# Patient Record
Sex: Female | Born: 2000 | Hispanic: Yes | Marital: Single | Smoking: Never smoker
Health system: Southern US, Community
[De-identification: ages and names within clinical notes are randomized; demographics above are authoritative.]

## PROBLEM LIST (undated history)

## (undated) DIAGNOSIS — Z91018 Allergy to other foods: Secondary | ICD-10-CM

## (undated) HISTORY — DX: Allergy to other foods: Z91.018

---
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
## 2021-02-27 DIAGNOSIS — H5213 Myopia, bilateral: Secondary | ICD-10-CM | POA: Diagnosis not present

## 2021-04-27 ENCOUNTER — Other Ambulatory Visit: Payer: Self-pay

## 2021-04-27 ENCOUNTER — Encounter: Payer: Self-pay | Admitting: Pediatrics

## 2021-04-27 ENCOUNTER — Ambulatory Visit (INDEPENDENT_AMBULATORY_CARE_PROVIDER_SITE_OTHER): Payer: Medicaid Other | Admitting: Pediatrics

## 2021-04-27 VITALS — Wt 129.8 lb

## 2021-04-27 DIAGNOSIS — L739 Follicular disorder, unspecified: Secondary | ICD-10-CM

## 2021-04-27 DIAGNOSIS — L910 Hypertrophic scar: Secondary | ICD-10-CM | POA: Diagnosis not present

## 2021-04-27 MED ORDER — MUPIROCIN 2 % EX OINT: 1.0000 "application " | TOPICAL_OINTMENT | Freq: Two times a day (BID) | CUTANEOUS | 0 refills | Status: DC

## 2021-04-27 NOTE — Progress Notes (Signed)
PCP: Clifton Custard, MD   Chief Complaint  Patient presents with   Rash      Subjective:  HPI:  Carolyn Bautista is a 20 y.o. female presenting for concerns of bumps in her armpit, groin, and ear. Mom reports she noticed a ball in her right armpit one month ago and it worried her. It was about the size of a grape, firm, and mobile. It self resolved in two weeks. She thinks it was around the time of her menstrual period. She waxes her armpits. She is concerned her right ear keloid returned to its original size after steroid injection two weeks ago. She also reports bumps in her groin region that come and go. They are hard, painful, and quarter sized. Sometimes they contain clear fluid, sometimes pus. The pain is relieved with drainage of the bump. No fevers, redness, or significant swelling. She trims her pubic hair, does not shave.    REVIEW OF SYSTEMS:  GENERAL: not toxic appearing GI: no vomiting, diarrhea, constipation GU: no apparent dysuria, painful bumps in groin region SKIN: keloid right ear, left armpit swelling EXTREMITIES: No edema    Meds: Current Outpatient Medications  Medication Sig Dispense Refill   mupirocin ointment (BACTROBAN) 2 % Apply 1 application topically 2 (two) times daily. 22 g 0   EPINEPHrine 0.3 mg/0.3 mL IJ SOAJ injection INJ 0.3 ML IM ONCE (Patient not taking: Reported on 05/14/2020)     fluticasone (FLONASE) 50 MCG/ACT nasal spray Place 1-2 sprays into both nostrils daily as needed for allergies or rhinitis. 16 g 11   No current facility-administered medications for this visit.    ALLERGIES: No Active Allergies  PMH:  Past Medical History:  Diagnosis Date   Asthma, chronic 10/11/2013   Eczema 10/11/2013    PSH: History reviewed. No pertinent surgical history.  Social history:  Social History   Social History Narrative   Not on file    Family history: History reviewed. No pertinent family history.   Objective:   Physical Examination:   Temp:   Pulse:   BP:   (Blood pressure percentiles are not available for patients who are 18 years or older.)  Wt: 129 lb 12.8 oz (58.9 kg)  Ht:    BMI: There is no height or weight on file to calculate BMI. (85 %ile (Z= 1.03) based on CDC (Girls, 2-20 Years) BMI-for-age based on BMI available as of 01/08/2020 from contact on 01/08/2020.) GENERAL: Well appearing, no distress HEENT: NCAT, right ear keloid. NECK: Supple, no cervical LAD LUNGS: EWOB, CTAB, no wheeze, no crackles CARDIO: RRR, normal S1S2 no murmur, well perfused GU: No palpable inguinal lymph nodes. Evidence of resolved folliculitis on right side of mons. No abscess, erythema, swelling. EXTREMITIES: Warm and well perfused, no deformity NEURO: Awake, alert, interactive, normal strength, tone, sensation, and gait SKIN: No rash, ecchymosis or petechiae. No palpable axillary lymph nodes.     Assessment/Plan:   Carolyn Bautista is a 20 y.o. old female here for concerns of swelling in her armpit, groin, as well as follow up concerns for her keloid on her right ear. No notable axillary bumps or lymphadenopathy on exam. Could be axillary folliculitis, swollen sweat glands, or fibrocystic changes from her menstrual cycle. Genitourinary exam with signs of resolved folliculitis. Right ear keloid on posterior earlobe, reported as returned to original size prior to her steroid injection.  1. Folliculitis  - Will prescribe as needed mupirocin ointment - Counseled on shaving techniques, scent free soaps, drainage and  treatment - Counseled on when to seek oral antibiotics   2. Keloid  - Suggested follow up with dermatology who is already involved with her case  - Counseled on keloid appearance, symptoms, treatment   Follow up: Return if symptoms worsen or fail to improve.

## 2021-04-27 NOTE — Patient Instructions (Addendum)
  Adult Primary Care Clinics Name Criteria Services  [facility] Insurance   GCCN  Uninsured  Medicaid A "medical home" for adults needing healthcare when it's not an emergency. Chronic disease management Disease prevention, diagnosis and treatment Onsite point-of-care laboratory testing. Health education and prevention programs. Physicals and immunizations  [address], [place], [place] [address]  Phone: [phone] Hours: Mon-Fri 9am-6pm Walk-in: Tues 2pm-5pm                 Thurs 8:30am-4:30pm Languages:  Language line available  Serves Adult patients  WALK IN HOURS FOR CLINIC (HOSPITAL DISCOUNT): TUESDAYS 2:00PM - 5:00PM and THURSDAYS 8:30AM - 4:30PM  Space is limited, 10 on Tuesday and 20 on Thursday. It's on first come first serve basis  Name Criteria Services  [facility] Insurance   Medicaid  Primary Care  [address], [place], [place] [address]  Phone: [phone]  Languages:  Access to language line      . Women's Health/ OB Resources  Name Criteria Services  [facility] Insurance:   Medicaid  Obstetrics & gynecology     [facility] [address] [place], [place] [address]  [phone] Languages:   All (phone interpreter available)    Name Criteria Services  [facility] [place]    Hours: Mon 2pm-7pm Tues 9am-5pm Wed & Thurs- Closed Fri 9am-5pm Sat 9am-1pm Insurance   Aetna Blue Cross Blue Shield (BCBS) [employer] Med[employer] Medicaid [facility] Abortion referral Birth Control General Health Care HIV testing [facility] Morning-after Pill Pregnancy testing & services STD testing, treatment & vaccines Women's Health care  [address] [place], [place] [address]  Phone: [phone] Fax: [phone] Languages:  English   Name Criteria Services  [facility] [place]   Hours: Monday 9am-5pm Tuesday 10am-6pm Wednesday 9am-5pm Thursday 11am-7pm Friday 9am-1pm Insurance   Aetna Blue Cross Blue Shield (BCBS) [employer] Med[employer] Medicaid [facility] Abortion services Birth control General health care HIV testing [facility] Morning-after pill Pregnancy testing & services STD testing, treatment & vaccines  [address] [place], [place] [address]  Phone: [phone] Fax: [phone] Languages:  Interpretation by phone available for other languages   Name Criteria Services  [facility]  Focus: Empowering women & men to face unplanned pregnancy Insurance    Free pregnancy tests, ultrasounds, and STD tests Abortion information (about procedures, risks, alternatives- NOT performing or referring for them) Sexual health education Classes: parenting, abortion recovery, communication in relationships  [address] [place], [place] [address]  Phone: [phone] or [phone]  Hours: Monday, Wednesday, Friday 10am-5pm Tuesday, Thursday 1pm-9pm Languages:  Spanish   Christian-based organization    

## 2021-05-28 ENCOUNTER — Ambulatory Visit: Payer: Medicaid Other | Admitting: Pediatrics

## 2021-06-02 ENCOUNTER — Telehealth: Payer: Self-pay | Admitting: Pediatrics

## 2021-06-02 ENCOUNTER — Encounter: Payer: Self-pay | Admitting: Pediatrics

## 2021-06-02 ENCOUNTER — Other Ambulatory Visit (HOSPITAL_COMMUNITY)
Admission: RE | Admit: 2021-06-02 | Discharge: 2021-06-02 | Disposition: A | Discharge status: Home / Self Care | Payer: Medicaid Other | Source: Ambulatory Visit | Attending: Pediatrics | Admitting: Pediatrics

## 2021-06-02 ENCOUNTER — Other Ambulatory Visit: Payer: Self-pay

## 2021-06-02 ENCOUNTER — Ambulatory Visit (INDEPENDENT_AMBULATORY_CARE_PROVIDER_SITE_OTHER): Payer: Medicaid Other | Admitting: Pediatrics

## 2021-06-02 VITALS — BP 108/74 | Ht 59.45 in | Wt 129.5 lb

## 2021-06-02 DIAGNOSIS — Z91018 Allergy to other foods: Secondary | ICD-10-CM

## 2021-06-02 DIAGNOSIS — Z114 Encounter for screening for human immunodeficiency virus [HIV]: Secondary | ICD-10-CM | POA: Diagnosis not present

## 2021-06-02 DIAGNOSIS — Z68.41 Body mass index (BMI) pediatric, 5th percentile to less than 85th percentile for age: Secondary | ICD-10-CM

## 2021-06-02 DIAGNOSIS — Z23 Encounter for immunization: Secondary | ICD-10-CM

## 2021-06-02 DIAGNOSIS — Z6825 Body mass index (BMI) 25.0-25.9, adult: Secondary | ICD-10-CM | POA: Diagnosis not present

## 2021-06-02 DIAGNOSIS — Z113 Encounter for screening for infections with a predominantly sexual mode of transmission: Secondary | ICD-10-CM | POA: Insufficient documentation

## 2021-06-02 DIAGNOSIS — Z0000 Encounter for general adult medical examination without abnormal findings: Secondary | ICD-10-CM | POA: Diagnosis not present

## 2021-06-02 LAB — POCT RAPID HIV: Rapid HIV, POC: NEGATIVE

## 2021-06-02 MED ORDER — EPINEPHRINE 0.3 MG/0.3ML IJ SOAJ: INTRAMUSCULAR | 1 refills | Status: DC

## 2021-06-02 NOTE — Progress Notes (Signed)
Adolescent Well Care Visit Carolyn Bautista is a 20 y.o. female who is here for well care.    PCP:  Clifton Custard, MD   History was provided by the patient and mother.  Confidentiality was discussed with the patient and, if applicable, with caregiver as well.  Current Issues: Current concerns include using flonase in springtime for allergies - needs refills.    History of eye swelling after getting mosquito bite.  History allergic reaction with canned pineapple, but can eat fresh pineapple.  Also has allergy to cat.    Nutrition/Exercise: Nutrition/Eating Behaviors: balanced diet - mom cooks and sends to school Supplements/ Vitamins: none Play any Sports?/ Exercise: none  Sleep:  Sleep: all night, no concern  Social Screening: Lives with:  roommate on campus Parental relations:  good Concerns regarding behavior with peers?  no Stressors of note: no  Education: School Name: [employer] Grade: first year - studying biology, wants to be dentist  Menstruation:   No LMP recorded. Menstrual History: regular, lasts 3-4 days   Confidential Social History: Tobacco?  no Secondhand smoke exposure?  no Drugs/ETOH?  no  Sexually Active?  no   Pregnancy Prevention: abstinence - discussed condoms and birth control   Screenings: Patient has a dental home: yes  The patient completed the Rapid Assessment for Adolescent Preventive Services screening questionnaire and the following topics were identified as risk factors and discussed: healthy eating and exercise  In addition, the following topics were discussed as part of anticipatory guidance tobacco use, marijuana use, condom use, and birth control.  PHQ-9 completed and results indicated no signs of depression  Physical Exam:  Vitals:   06/02/21 0834  BP: 108/74  Weight: 129 lb 8 oz (58.7 kg)  Height: 4' 11.45" (1.51 m)   BP 108/74 (BP Location: Right Arm, Patient Position: Sitting, Cuff Size: Normal)   Ht 4' 11.45" (1.51  m)   Wt 129 lb 8 oz (58.7 kg)   BMI 25.76 kg/m  Body mass index: body mass index is 25.76 kg/m. Blood pressure percentiles are not available for patients who are 18 years or older.  Hearing Screening  Method: Audiometry   500Hz  1000Hz  2000Hz  4000Hz   Right ear 20 20 20 20   Left ear 20 20 20 20    Vision Screening   Right eye Left eye Both eyes  Without correction 20/30 20/20 20/20   With correction       General Appearance:   alert, oriented, no acute distress and well nourished  HENT: Normocephalic, no obvious abnormality, conjunctiva clear  Mouth:   Normal appearing teeth, no obvious discoloration, dental caries, or dental caps  Neck:   Supple; thyroid: no enlargement, symmetric, no tenderness/mass/nodules  Chest Normal female, no masses  Lungs:   Clear to auscultation bilaterally, normal work of breathing  Heart:   Regular rate and rhythm, S1 and S2 normal, no murmurs;   Abdomen:   Soft, non-tender, no mass, or organomegaly  GU normal female external genitalia, pelvic not performed, Tanner stage IV  Musculoskeletal:   Tone and strength strong and symmetrical, all extremities               Lymphatic:   No cervical adenopathy  Skin/Hair/Nails:   Skin warm, dry and intact, no rashes, no bruises or petechiae  Neurologic:   Strength, gait, and coordination normal and age-appropriate     Assessment and Plan:   1. Encounter for general adult medical examination without abnormal findings  2. BMI  pediatric, 5th percentile to less than 85% for age  5. Routine screening for STI (sexually transmitted infection) Patient denies sexual activity - at risk age group. - Urine cytology ancillary only - POCT Rapid HIV  4. Food allergy Refilled Epipen and placed referral back to allergist for consideration of oral challenge to canned pineapple.  - EPINEPHrine 0.3 mg/0.3 mL IJ SOAJ injection; INJ 0.3 ML IM ONCE  Dispense: 2 each; Refill: 1 - Ambulatory referral to Allergy  Hearing  screening result:normal Vision screening result: abnormal  Counseling provided for all of the vaccine components  Orders Placed This Encounter  Procedures   Meningococcal B, OMV     Return for nurse visit for Meningitis B vaccine #2 after 06/30/21.Marland Kitchen  Clifton Custard, MD

## 2021-06-02 NOTE — Telephone Encounter (Signed)
Good morning, pt needs address on [facility] to match on epic. Please give pt a call once the address on file in Epic has been changed in [place]. Thank you.

## 2021-06-02 NOTE — Telephone Encounter (Signed)
Called and let mother know address has been updated in [facility] to current address on file in Epic. Advised new immunization records can be picked up from our front desk if needed. Please print new copies from [facility] if mother comes to pick up.  

## 2021-06-03 LAB — URINE CYTOLOGY ANCILLARY ONLY
Chlamydia: NEGATIVE
Comment: NEGATIVE
Comment: NORMAL
Neisseria Gonorrhea: NEGATIVE

## 2021-07-06 ENCOUNTER — Other Ambulatory Visit: Payer: Self-pay

## 2021-07-06 ENCOUNTER — Ambulatory Visit (INDEPENDENT_AMBULATORY_CARE_PROVIDER_SITE_OTHER): Payer: Medicaid Other

## 2021-07-06 DIAGNOSIS — Z23 Encounter for immunization: Secondary | ICD-10-CM

## 2021-07-06 NOTE — Progress Notes (Signed)
Carolyn Bautista is here for MenB #2 and seasonal flu vaccine; no other questions or concerns today. Vaccines given and tolerated well; discharged home with updated vaccine record. RTC 05/2022 for PE and prn for acute care.

## 2021-07-31 ENCOUNTER — Other Ambulatory Visit: Payer: Self-pay

## 2021-07-31 ENCOUNTER — Ambulatory Visit (INDEPENDENT_AMBULATORY_CARE_PROVIDER_SITE_OTHER): Payer: Medicaid Other | Admitting: Allergy

## 2021-07-31 ENCOUNTER — Encounter: Payer: Self-pay | Admitting: Allergy

## 2021-07-31 VITALS — BP 110/78 | HR 78 | Temp 98.3°F | Resp 18 | Ht 59.0 in | Wt 125.2 lb

## 2021-07-31 DIAGNOSIS — J4520 Mild intermittent asthma, uncomplicated: Secondary | ICD-10-CM | POA: Diagnosis not present

## 2021-07-31 DIAGNOSIS — W57XXXD Bitten or stung by nonvenomous insect and other nonvenomous arthropods, subsequent encounter: Secondary | ICD-10-CM

## 2021-07-31 DIAGNOSIS — S00461D Insect bite (nonvenomous) of right ear, subsequent encounter: Secondary | ICD-10-CM

## 2021-07-31 DIAGNOSIS — T7800XD Anaphylactic reaction due to unspecified food, subsequent encounter: Secondary | ICD-10-CM

## 2021-07-31 DIAGNOSIS — J3089 Other allergic rhinitis: Secondary | ICD-10-CM

## 2021-07-31 MED ORDER — ALBUTEROL SULFATE HFA 108 (90 BASE) MCG/ACT IN AERS: 2.0000 | INHALATION_SPRAY | RESPIRATORY_TRACT | 1 refills | Status: DC | PRN

## 2021-07-31 MED ORDER — TRIAMCINOLONE ACETONIDE 0.5 % EX OINT: 1.0000 "application " | TOPICAL_OINTMENT | Freq: Two times a day (BID) | CUTANEOUS | 0 refills | Status: DC

## 2021-07-31 NOTE — Patient Instructions (Addendum)
Food Allergy -skin testing to pineapple is negative.  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  This is a long-acting antihistamine similar to Zyrtec that may be more effective -continue Flonase 2 sprays each nostril daily for 1-2 weeks at a time before stopping once nasal congestion improves for maximum benefit  Asthma -have access to albuterol inhaler 2 puffs every 4-6 hours as needed for cough/wheeze/shortness of breath/chest tightness.  May use 15-20 minutes prior to activity.   Monitor frequency of use.    Asthma control goals:  Full participation in all desired activities (may need albuterol before activity) Albuterol use two time or less a week on average (not counting use with activity) Cough interfering with sleep two time or less a month Oral steroids no more than once a year No hospitalizations  Mosquito bite sensitivity -do your best to avoid bites.  Bug repellant containing DEET are the best options to reduce bites -if you are bitten can use the following: ice affected area oral antihistamine (like Benadryl or Xyzal) oral anti-inflammatory (like Ibuprofen) topical corticosteroid (like Triamcinolone ointment)  Follow-up in 6 months or sooner if needed

## 2021-07-31 NOTE — Progress Notes (Signed)
New Patient Note  RE: Carolyn Bautista MRN: [id number] DOB: 2001-08-17 Date of Office Visit: 07/31/2021  Referring provider: Clifton Custard, MD Primary care provider: Clifton Custard, MD  Chief Complaint: Food allergy  History of present illness: Carolyn Bautista is a 20 y.o. female presenting today for consultation for food allergy.  She is a former pt of the practice with last visit about 10 years ago.   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   Mother states around 38 months old she gave her a pineapple cup and she immediately got red and had bumps all over.  Mother took her to ED for this.   At some point later she saw allergist and had positive testing to my level pineapple.  She has been avoiding ever since.    She states she is quite reactive to mosquito bites and she develop symptoms of significant swelling and fever.  She uses flonase as needed for nasal congestion.   She also reports runny nose and sneezing.  She has taken zyrtec but that didn't help.  She used to take singulair in the past as a child.    She has history of asthma.  She no longer has an albuterol inhaler and last had about 1 year ago.  She does not report any specific triggers but states she does get short of breath and has to stop physical activity.   Review of systems: Review of Systems  Constitutional: Negative.   HENT:  Positive for congestion.   Eyes: Negative.   Respiratory: Negative.    Cardiovascular: Negative.   Gastrointestinal: Negative.   Musculoskeletal: Negative.   Skin: Negative.   Neurological: Negative.    All other systems negative unless noted above in HPI  Past medical history: Past Medical History:  Diagnosis Date   Asthma, chronic 10/11/2013   Eczema 10/11/2013   Food allergy     Past surgical history: History reviewed. No pertinent surgical history.  Family history:  Family History  Problem Relation Age of Onset   Asthma  Mother    Asthma Sister    Asthma Sister     Social history: Lives in a home without carpeting with gas heating and central cooling.  Dogs in the home.  No concern for water damage, mildew or roaches in the home.  Freshman at [employer].  Denies smoking history.    Medication List: Current Outpatient Medications  Medication Sig Dispense Refill   EPINEPHrine 0.3 mg/0.3 mL IJ SOAJ injection INJ 0.3 ML IM ONCE 2 each 1   fluticasone (FLONASE) 50 MCG/ACT nasal spray Place 1-2 sprays into both nostrils daily as needed for allergies or rhinitis. (Patient not taking: No sig reported) 16 g 11   No current facility-administered medications for this visit.    Known medication allergies: No Known Allergies   Physical examination: Blood pressure 110/78, pulse 78, temperature 98.3 F (36.8 C), temperature source Temporal, resp. rate 18, height 4\' 11"  (1.499 m), weight 125 lb 4 oz (56.8 kg), SpO2 97 %.  General: Alert, interactive, in no acute distress. HEENT: PERRLA, TMs pearly gray, turbinates non-edematous without discharge, post-pharynx non erythematous. Neck: Supple without lymphadenopathy. Lungs: Clear to auscultation without wheezing, rhonchi or rales. {no increased work of breathing. CV: Normal S1, S2 without murmurs. Abdomen: Nondistended, nontender. Skin: Warm and dry, without lesions or rashes. Extremities:  No clubbing, cyanosis or edema. Neuro:   Grossly intact.  Diagnositics/Labs:  Spirometry:  FEV1: 2.49L 86%, FVC: 3.59L 113%, ratio consistent with nonobstructive pattern  Allergy testing: environmental allergy skin prick testing is oak and alternaira Pineapple skin prick testing is negative Allergy testing results were read and interpreted by provider, documented by clinical staff.   Assessment and plan:   Food Allergy -skin testing to pineapple is negative.  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  This is a long-acting antihistamine similar to Zyrtec that may be more effective -continue Flonase 2 sprays each nostril daily for 1-2 weeks at a time before stopping once nasal congestion improves for maximum benefit  Asthma -have access to albuterol inhaler 2 puffs every 4-6 hours as needed for cough/wheeze/shortness of breath/chest tightness.  May use 15-20 minutes prior to activity.   Monitor frequency of use.    Asthma control goals:  Full participation in all desired activities (may need albuterol before activity) Albuterol use two time or less a week on average (not counting use with activity) Cough interfering with sleep two time or less a month Oral steroids no more than once a year No hospitalizations  Mosquito bite sensitivity -do your best to avoid bites.  Bug repellant containing DEET are the best options to reduce bites -if you are bitten can use the following: ice affected area oral antihistamine (like Benadryl or Xyzal) oral anti-inflammatory (like Ibuprofen) topical corticosteroid (like Triamcinolone ointment)  Follow-up in 6 months or sooner if needed  I appreciate the opportunity to take part in Akesha's care. Please do not hesitate to contact me with questions.  Sincerely,   , MD Allergy/Immunology [facility]

## 2021-08-07 LAB — ALLERGEN, PINEAPPLE, F210: Pineapple IgE: <0.1 kU/L

## 2021-09-08 ENCOUNTER — Other Ambulatory Visit: Payer: Self-pay | Admitting: Allergy

## 2022-02-28 DIAGNOSIS — H5213 Myopia, bilateral: Secondary | ICD-10-CM | POA: Diagnosis not present

## 2022-04-06 ENCOUNTER — Encounter (HOSPITAL_COMMUNITY): Payer: Self-pay

## 2022-04-06 ENCOUNTER — Ambulatory Visit (HOSPITAL_COMMUNITY)
Admission: EM | Admit: 2022-04-06 | Discharge: 2022-04-06 | Disposition: A | Discharge status: Home / Self Care | Payer: Medicaid Other | Attending: Emergency Medicine | Admitting: Emergency Medicine

## 2022-04-06 DIAGNOSIS — R21 Rash and other nonspecific skin eruption: Secondary | ICD-10-CM

## 2022-04-06 DIAGNOSIS — L237 Allergic contact dermatitis due to plants, except food: Secondary | ICD-10-CM

## 2022-04-06 MED ORDER — HYDROCORTISONE 2.5 % EX LOTN: TOPICAL_LOTION | Freq: Two times a day (BID) | CUTANEOUS | 0 refills | Status: DC

## 2022-04-06 NOTE — ED Provider Notes (Signed)
[facility]    CSN: [id number] Arrival date & time: 04/06/22  1834     History   Chief Complaint Chief Complaint  Patient presents with   Rash    HPI Carolyn Bautista is a 21 y.o. female.  Presents with 1 week history of rash on right arm.  Worse over the last 2 days.  Itchy. Has been scratching at it and noticed some spots begin on the left arm. Has tried benadryl cream.  Denies fever, cough or congestion, shortness of breath, chest pain, abdominal pain, vomiting/diarrhea, lip or tongue swelling.  No known allergies.  History of eczema  Past Medical History:  Diagnosis Date   Asthma, chronic 10/11/2013   Eczema 10/11/2013   Food allergy     Patient Active Problem List   Diagnosis Date Noted   Wears glasses 11/17/2017   Allergic rhinitis 10/11/2013    History reviewed. No pertinent surgical history.  OB History   No obstetric history on file.      Home Medications    Prior to Admission medications   Medication Sig Start Date End Date Taking? Authorizing Provider  hydrocortisone 2.5 % lotion Apply topically 2 (two) times daily. 04/06/22  Yes Petina Muraski, Lurena Joiner, PA-C  albuterol (VENTOLIN HFA) 108 (90 Base) MCG/ACT inhaler Inhale 2 puffs into the lungs every 4 (four) hours as needed for wheezing or shortness of breath. 07/31/21   Marcelyn Bruins, MD  EPINEPHrine 0.3 mg/0.3 mL IJ SOAJ injection INJ 0.3 ML IM ONCE 06/02/21   Ettefagh, Aron Baba, MD  fluticasone [facility]) 50 MCG/ACT nasal spray Place 1-2 sprays into both nostrils daily as needed for allergies or rhinitis. Patient not taking: No sig reported 01/08/20   Ettefagh, Aron Baba, MD  triamcinolone ointment (KENALOG) 0.5 % Apply 1 application topically 2 (two) times daily. For insect bites 07/31/21   Marcelyn Bruins, MD    Family History Family History  Problem Relation Age of Onset   Asthma Mother    Asthma Sister    Asthma Sister     Social History Social History   Tobacco Use    Smoking status: Never   Smokeless tobacco: Never     Allergies   Patient has no known allergies.   Review of Systems Review of Systems Per HPI  Physical Exam Triage Vital Signs No data found.  Updated Vital Signs BP 123/79 (BP Location: Left Arm)   Pulse 97   Temp 98.8 F (37.1 C) (Oral)   Resp 16   LMP 03/06/2022 (Approximate)   SpO2 98%    Physical Exam Vitals and nursing note reviewed.  Constitutional:      General: She is not in acute distress.    Appearance: Normal appearance.  HENT:     Mouth/Throat:     Mouth: Mucous membranes are moist.     Pharynx: Oropharynx is clear. No posterior oropharyngeal erythema.  Eyes:     Conjunctiva/sclera: Conjunctivae normal.     Pupils: Pupils are equal, round, and reactive to light.  Cardiovascular:     Rate and Rhythm: Normal rate and regular rhythm.     Pulses: Normal pulses.     Heart sounds: Normal heart sounds.  Pulmonary:     Effort: Pulmonary effort is normal. No respiratory distress.     Breath sounds: Normal breath sounds. No wheezing.  Abdominal:     General: Bowel sounds are normal.     Tenderness: There is no abdominal tenderness.  Musculoskeletal:  General: Normal range of motion.     Cervical back: Normal range of motion.  Skin:    Findings: Rash present.     Comments: Maculopapular, erythematous area consistent with poison ivy, some linear streaks.  Located to right forearm, a couple areas beginning on the left arm  Neurological:     Mental Status: She is alert and oriented to person, place, and time.     UC Treatments / Results  Labs (all labs ordered are listed, but only abnormal results are displayed) Labs Reviewed - No data to display  EKG  Radiology No results found.  Procedures Procedures   Medications Ordered in UC Medications - No data to display  Initial Impression / Assessment and Plan / UC Course  I have reviewed the triage vital signs and the nursing  notes.  Pertinent labs & imaging results that were available during my care of the patient were reviewed by me and considered in my medical decision making (see chart for details).  Rash on right arm appears consistent with poison ivy.  Some mild spreading to left arm. She has not been outdoors or in the woods but she does have a dog who spends time outside.  Hydrocortisone cream twice daily.  Recommend to not itch at the area as this can cause spreading and possibly bacterial infection. Return precautions discussed. Patient agrees to plan and is discharged in stable condition.  Final Clinical Impressions(s) / UC Diagnoses   Final diagnoses:  Rash  Poison ivy     Discharge Instructions      Apply the topical steroid cream twice daily.  Please go to the emergency department if symptoms worsen.     ED Prescriptions     Medication Sig Dispense Auth. Provider   hydrocortisone 2.5 % lotion Apply topically 2 (two) times daily. 59 mL Avriana Joo, Lurena Joiner, PA-C      PDMP not reviewed this encounter.   Kathrine Haddock 04/06/22 [id number]

## 2022-04-06 NOTE — Discharge Instructions (Addendum)
Apply the topical steroid cream twice daily.  Please go to the emergency department if symptoms worsen.

## 2022-04-06 NOTE — ED Triage Notes (Signed)
Pt presents with rash on both arms x 2 days. She is using benadryl cream.

## 2022-04-13 DIAGNOSIS — H52223 Regular astigmatism, bilateral: Secondary | ICD-10-CM | POA: Diagnosis not present

## 2022-04-13 DIAGNOSIS — H5213 Myopia, bilateral: Secondary | ICD-10-CM | POA: Diagnosis not present

## 2022-05-06 DIAGNOSIS — L910 Hypertrophic scar: Secondary | ICD-10-CM | POA: Diagnosis not present

## 2022-05-06 DIAGNOSIS — L309 Dermatitis, unspecified: Secondary | ICD-10-CM | POA: Diagnosis not present

## 2022-05-06 DIAGNOSIS — L2089 Other atopic dermatitis: Secondary | ICD-10-CM | POA: Diagnosis not present

## 2022-07-27 DIAGNOSIS — L910 Hypertrophic scar: Secondary | ICD-10-CM | POA: Diagnosis not present

## 2022-10-05 DIAGNOSIS — L910 Hypertrophic scar: Secondary | ICD-10-CM | POA: Diagnosis not present

## 2022-10-06 ENCOUNTER — Ambulatory Visit: Payer: Medicaid Other | Admitting: Emergency Medicine

## 2022-11-26 ENCOUNTER — Ambulatory Visit (INDEPENDENT_AMBULATORY_CARE_PROVIDER_SITE_OTHER): Payer: Medicaid Other | Admitting: Internal Medicine

## 2022-11-26 ENCOUNTER — Encounter: Payer: Self-pay | Admitting: Internal Medicine

## 2022-11-26 VITALS — BP 110/78 | HR 79 | Temp 98.3°F | Ht 59.0 in | Wt 132.0 lb

## 2022-11-26 DIAGNOSIS — Z0000 Encounter for general adult medical examination without abnormal findings: Secondary | ICD-10-CM

## 2022-11-26 DIAGNOSIS — J45990 Exercise induced bronchospasm: Secondary | ICD-10-CM | POA: Diagnosis not present

## 2022-11-26 DIAGNOSIS — J3089 Other allergic rhinitis: Secondary | ICD-10-CM

## 2022-11-26 DIAGNOSIS — J302 Other seasonal allergic rhinitis: Secondary | ICD-10-CM | POA: Diagnosis not present

## 2022-11-26 MED ORDER — ALBUTEROL SULFATE HFA 108 (90 BASE) MCG/ACT IN AERS: 2.0000 | INHALATION_SPRAY | RESPIRATORY_TRACT | 1 refills | Status: DC | PRN

## 2022-11-26 MED ORDER — FLUTICASONE PROPIONATE 50 MCG/ACT NA SUSP: 1.0000 | Freq: Every day | NASAL | 11 refills | Status: DC | PRN

## 2022-11-26 NOTE — Assessment & Plan Note (Signed)
Uses flonase seasonally. Refilled today.

## 2022-11-26 NOTE — Progress Notes (Signed)
   Subjective:   Patient ID: Carolyn Bautista, female    DOB: Aug 28, 2001, 22 y.o.   MRN: [id number]  HPI The patient is a 22 YO female coming in new for physical  PMH, [facility], social history reviewed and updated  Review of Systems  Constitutional: Negative.   HENT: Negative.    Eyes: Negative.   Respiratory:  Negative for cough, chest tightness and shortness of breath.   Cardiovascular:  Negative for chest pain, palpitations and leg swelling.  Gastrointestinal:  Negative for abdominal distention, abdominal pain, constipation, diarrhea, nausea and vomiting.  Musculoskeletal: Negative.   Skin: Negative.   Neurological: Negative.   Psychiatric/Behavioral: Negative.      Objective:  Physical Exam Constitutional:      Appearance: She is well-developed.  HENT:     Head: Normocephalic and atraumatic.  Cardiovascular:     Rate and Rhythm: Normal rate and regular rhythm.  Pulmonary:     Effort: Pulmonary effort is normal. No respiratory distress.     Breath sounds: Normal breath sounds. No wheezing or rales.  Abdominal:     General: Bowel sounds are normal. There is no distension.     Palpations: Abdomen is soft.     Tenderness: There is no abdominal tenderness. There is no rebound.  Musculoskeletal:     Cervical back: Normal range of motion.  Skin:    General: Skin is warm and dry.  Neurological:     Mental Status: She is alert and oriented to person, place, and time.     Coordination: Coordination normal.     Vitals:   11/26/22 0807  BP: 110/78  Pulse: 79  Temp: 98.3 F (36.8 C)  TempSrc: Oral  SpO2: 97%  Weight: 132 lb (59.9 kg)  Height: '4\' 11"'$  (1.499 m)    Assessment & Plan:

## 2022-11-26 NOTE — Assessment & Plan Note (Signed)
Flu shot declines. Covid-19 counseled. Tetanus up to date. HPV complete STI screening offered declines. Counseled about sun safety and mole surveillance. Counseled about the dangers of distracted driving. Given 10 year screening recommendations.

## 2022-11-26 NOTE — Assessment & Plan Note (Signed)
Uses rare albuterol prior to exercise. Needs refill. Done today.

## 2022-12-13 ENCOUNTER — Ambulatory Visit (INDEPENDENT_AMBULATORY_CARE_PROVIDER_SITE_OTHER): Payer: Medicaid Other | Admitting: Family Medicine

## 2022-12-13 ENCOUNTER — Encounter: Payer: Self-pay | Admitting: Family Medicine

## 2022-12-13 VITALS — BP 108/70 | HR 62 | Temp 98.2°F | Resp 20 | Ht 59.0 in | Wt 130.0 lb

## 2022-12-13 DIAGNOSIS — J069 Acute upper respiratory infection, unspecified: Secondary | ICD-10-CM

## 2022-12-13 DIAGNOSIS — Z1152 Encounter for screening for COVID-19: Secondary | ICD-10-CM | POA: Diagnosis not present

## 2022-12-13 DIAGNOSIS — J4521 Mild intermittent asthma with (acute) exacerbation: Secondary | ICD-10-CM

## 2022-12-13 LAB — POC COVID19 BINAXNOW: SARS Coronavirus 2 Ag: NEGATIVE

## 2022-12-13 MED ORDER — PREDNISONE 20 MG PO TABS: 40.0000 mg | ORAL_TABLET | Freq: Every day | ORAL | 0 refills | Status: AC

## 2022-12-13 NOTE — Progress Notes (Signed)
Assessment & Plan:  1. Mild intermittent asthma with exacerbation Continue albuterol as needed.  Starting on prednisone x 5 days. - predniSONE (DELTASONE) 20 MG tablet; Take 2 tablets (40 mg total) by mouth daily for 5 days.  Dispense: 10 tablet; Refill: 0  2. Viral URI Education provided on viral URIs.  Discussed typical duration and course of a viral URI.  Encouraged symptom management including cough syrup (Delsym), Tylenol/Ibuprofen, nasal decongestant spray (no more than 3 days), Vicks, and a humidifier at night.  Note provided to be out of school today and return tomorrow.  3. Encounter for screening for COVID-19 - POC COVID-19 BinaxNow  Results for orders placed or performed in visit on 12/13/22  POC COVID-19 BinaxNow  Result Value Ref Range   SARS Coronavirus 2 Ag Negative Negative    Follow up plan: Return if symptoms worsen or fail to improve.  Hendricks Limes, MSN, APRN, FNP-C  Subjective:  HPI: Carolyn Bautista is a 22 y.o. female presenting on 12/13/2022 for Cough (Fever and runny nose - started Friday )  Patient complains of cough, runny nose, and fever. She has not taken her temperature, but just feels like she is running a fever.  She denies sore throat, nausea, vomiting, abdominal pain, and diarrhea. Onset of symptoms was 3 days ago, unchanged since that time. She is drinking plenty of fluids. Evaluation to date: none. Treatment to date: nasal steroids and Albuterol inhaler . She has a history of asthma. She does not smoke.    ROS: Negative unless specifically indicated above in HPI.   Relevant past medical history reviewed and updated as indicated.   Allergies and medications reviewed and updated.   Current Outpatient Medications:    albuterol (VENTOLIN HFA) 108 (90 Base) MCG/ACT inhaler, Inhale 2 puffs into the lungs every 4 (four) hours as needed for wheezing or shortness of breath., Disp: 1 each, Rfl: 1   fluticasone (FLONASE) 50 MCG/ACT nasal spray, Place 1-2  sprays into both nostrils daily as needed for allergies or rhinitis., Disp: 16 g, Rfl: 11  No Known Allergies  Objective:   BP 108/70   Pulse 62   Temp 98.2 F (36.8 C)   Resp 20   Ht 4\' 11"  (1.499 m)   Wt 130 lb (59 kg)   LMP 11/21/2022   BMI 26.26 kg/m    Physical Exam Vitals reviewed.  Constitutional:      General: She is not in acute distress.    Appearance: Normal appearance. She is not ill-appearing, toxic-appearing or diaphoretic.  HENT:     Head: Normocephalic and atraumatic.     Right Ear: Tympanic membrane, ear canal and external ear normal. There is impacted cerumen.     Left Ear: Ear canal and external ear normal. There is no impacted cerumen. Tympanic membrane is erythematous. Tympanic membrane is not injected, scarred, perforated, retracted or bulging.     Nose: Congestion present. No rhinorrhea.     Right Sinus: No maxillary sinus tenderness or frontal sinus tenderness.     Left Sinus: No maxillary sinus tenderness or frontal sinus tenderness.     Mouth/Throat:     Mouth: Mucous membranes are moist.     Pharynx: Oropharynx is clear. Posterior oropharyngeal erythema present. No oropharyngeal exudate.     Tonsils: No tonsillar exudate or tonsillar abscesses.  Eyes:     General: No scleral icterus.       Right eye: No discharge.        Left  eye: No discharge.     Conjunctiva/sclera: Conjunctivae normal.  Cardiovascular:     Rate and Rhythm: Normal rate and regular rhythm.     Heart sounds: Normal heart sounds. No murmur heard.    No friction rub. No gallop.  Pulmonary:     Effort: Pulmonary effort is normal. No respiratory distress.     Breath sounds: Normal breath sounds. No stridor. No wheezing, rhonchi or rales.  Musculoskeletal:        General: Normal range of motion.     Cervical back: Normal range of motion.  Lymphadenopathy:     Cervical: No cervical adenopathy.  Skin:    General: Skin is warm and dry.     Capillary Refill: Capillary refill takes  less than 2 seconds.  Neurological:     General: No focal deficit present.     Mental Status: She is alert and oriented to person, place, and time. Mental status is at baseline.  Psychiatric:        Mood and Affect: Mood normal.        Behavior: Behavior normal.        Thought Content: Thought content normal.        Judgment: Judgment normal.

## 2022-12-13 NOTE — Patient Instructions (Signed)
Cough syrup (Delsym), Tylenol/Ibuprofen, nasal decongestant spray (no more than 3 days), Vicks, and a humidifier at night.

## 2023-01-12 ENCOUNTER — Other Ambulatory Visit: Payer: Self-pay | Admitting: Internal Medicine

## 2023-01-21 ENCOUNTER — Other Ambulatory Visit: Payer: Self-pay

## 2023-01-21 MED ORDER — VENTOLIN HFA 108 (90 BASE) MCG/ACT IN AERS: 2.0000 | INHALATION_SPRAY | Freq: Four times a day (QID) | RESPIRATORY_TRACT | 2 refills | Status: DC | PRN

## 2024-02-06 ENCOUNTER — Ambulatory Visit: Admitting: Internal Medicine

## 2024-02-15 ENCOUNTER — Encounter: Payer: Self-pay | Admitting: Internal Medicine

## 2024-02-15 ENCOUNTER — Ambulatory Visit: Admitting: Internal Medicine

## 2024-02-15 VITALS — BP 112/80 | HR 76 | Temp 98.1°F | Ht 59.0 in | Wt 137.0 lb

## 2024-02-15 DIAGNOSIS — Z0000 Encounter for general adult medical examination without abnormal findings: Secondary | ICD-10-CM | POA: Diagnosis not present

## 2024-02-15 DIAGNOSIS — Z23 Encounter for immunization: Secondary | ICD-10-CM

## 2024-02-15 DIAGNOSIS — J302 Other seasonal allergic rhinitis: Secondary | ICD-10-CM | POA: Diagnosis not present

## 2024-02-15 DIAGNOSIS — J45990 Exercise induced bronchospasm: Secondary | ICD-10-CM

## 2024-02-15 DIAGNOSIS — Z7184 Encounter for health counseling related to travel: Secondary | ICD-10-CM | POA: Diagnosis not present

## 2024-02-15 MED ORDER — ATOVAQUONE-PROGUANIL HCL 62.5-25 MG PO TABS: 1.0000 | ORAL_TABLET | Freq: Every day | ORAL | 0 refills | Status: AC

## 2024-02-15 MED ORDER — HYDROCORTISONE 1 % EX OINT: 1.0000 | TOPICAL_OINTMENT | Freq: Two times a day (BID) | CUTANEOUS | 0 refills | Status: AC

## 2024-02-15 MED ORDER — VIVOTIF PO CPDR: 1.0000 | DELAYED_RELEASE_CAPSULE | ORAL | 0 refills | Status: DC

## 2024-02-15 MED ORDER — CETIRIZINE HCL 10 MG PO TABS: 10.0000 mg | ORAL_TABLET | Freq: Every day | ORAL | 11 refills | Status: DC

## 2024-02-15 MED ORDER — EPINEPHRINE 0.3 MG/0.3ML IJ SOAJ: 0.3000 mg | INTRAMUSCULAR | 0 refills | Status: AC | PRN

## 2024-02-15 MED ORDER — VENTOLIN HFA 108 (90 BASE) MCG/ACT IN AERS: 2.0000 | INHALATION_SPRAY | Freq: Four times a day (QID) | RESPIRATORY_TRACT | 2 refills | Status: AC | PRN

## 2024-02-15 MED ORDER — FLUTICASONE PROPIONATE 50 MCG/ACT NA SUSP: 1.0000 | Freq: Every day | NASAL | 11 refills | Status: AC | PRN

## 2024-02-15 NOTE — Assessment & Plan Note (Signed)
 Refill albuterol  in case needed and flonase  and zyrtec .

## 2024-02-15 NOTE — Patient Instructions (Addendum)
 We have sent in the oral typhoid vaccine which is 4 pills. Take 1 pill every other day and finish at least 2 weeks before you leave. (I,e take 1 pill Monday, then 1 pill Wednesday, then 1 pill Friday then 1 pill Sunday).   We have sent in the malaria prevention to take 1 pill daily. Start 2 days prior to arrival in [place], take throughout the trip and continue taking for 2 weeks once you get home.   For the stomach pepcid or omeprazole  over the counter should be safe and effective.

## 2024-02-15 NOTE — Progress Notes (Signed)
   Subjective:   Patient ID: Carolyn Bautista, female    DOB: 11-06-2000, 23 y.o.   MRN: [id number]  HPI The patient is here for physical. Also wants travel advice going to [place] in July.   PMH, [facility], social history reviewed and updated  Review of Systems  Constitutional: Negative.   HENT: Negative.    Eyes: Negative.   Respiratory:  Negative for cough, chest tightness and shortness of breath.   Cardiovascular:  Negative for chest pain, palpitations and leg swelling.  Gastrointestinal:  Negative for abdominal distention, abdominal pain, constipation, diarrhea, nausea and vomiting.  Musculoskeletal: Negative.   Skin: Negative.   Neurological: Negative.   Psychiatric/Behavioral: Negative.      Objective:  Physical Exam Constitutional:      Appearance: She is well-developed.  HENT:     Head: Normocephalic and atraumatic.  Cardiovascular:     Rate and Rhythm: Normal rate and regular rhythm.  Pulmonary:     Effort: Pulmonary effort is normal. No respiratory distress.     Breath sounds: Normal breath sounds. No wheezing or rales.  Abdominal:     General: Bowel sounds are normal. There is no distension.     Palpations: Abdomen is soft.     Tenderness: There is no abdominal tenderness. There is no rebound.  Musculoskeletal:     Cervical back: Normal range of motion.  Skin:    General: Skin is warm and dry.  Neurological:     Mental Status: She is alert and oriented to person, place, and time.     Coordination: Coordination normal.     Vitals:   02/15/24 0811  BP: 112/80  Pulse: 76  Temp: 98.1 F (36.7 C)  TempSrc: Oral  SpO2: 98%  Weight: 137 lb (62.1 kg)  Height: 4\' 11"  (1.499 m)    Assessment & Plan:  Tdap given at visit

## 2024-02-15 NOTE — Assessment & Plan Note (Signed)
 Going to [place] in [employer] and malaria prevention advised by CDC done rx atovaquone/proguanil start 2 days prior take throughout trip and continue 2 weeks after return 1 pill daily. Rx done. Also needs typhoid rx done for oral take 1 pill every other day for 4 doses finish at least 2 weeks prior to trip. Hep A and B and MMR up to date.

## 2024-02-15 NOTE — Assessment & Plan Note (Signed)
 Flu shot yearly. Pneumonia not due. Tetanus given at visit. Pap smear counseled due. Counseled about sun safety and mole surveillance. Counseled about the dangers of distracted driving. Given 10 year screening recommendations.

## 2024-03-19 ENCOUNTER — Encounter: Payer: Self-pay | Admitting: Internal Medicine

## 2024-03-20 MED ORDER — VIVOTIF PO CPDR: 1.0000 | DELAYED_RELEASE_CAPSULE | ORAL | 0 refills | Status: AC

## 2024-03-20 NOTE — Telephone Encounter (Signed)
**Note De-identified  [place] Obfuscation** Please advise 

## 2024-05-07 ENCOUNTER — Encounter: Payer: Self-pay | Admitting: Family Medicine

## 2024-05-07 ENCOUNTER — Ambulatory Visit: Admitting: Family Medicine

## 2024-05-07 ENCOUNTER — Ambulatory Visit: Payer: Self-pay

## 2024-05-07 VITALS — BP 110/68 | HR 85 | Temp 98.4°F | Ht 59.0 in | Wt 137.2 lb

## 2024-05-07 DIAGNOSIS — S0096XA Insect bite (nonvenomous) of unspecified part of head, initial encounter: Secondary | ICD-10-CM | POA: Diagnosis not present

## 2024-05-07 DIAGNOSIS — L299 Pruritus, unspecified: Secondary | ICD-10-CM | POA: Insufficient documentation

## 2024-05-07 DIAGNOSIS — W57XXXA Bitten or stung by nonvenomous insect and other nonvenomous arthropods, initial encounter: Secondary | ICD-10-CM | POA: Insufficient documentation

## 2024-05-07 MED ORDER — TRIAMCINOLONE ACETONIDE 0.1 % EX CREA: 1.0000 | TOPICAL_CREAM | Freq: Two times a day (BID) | CUTANEOUS | 0 refills | Status: AC

## 2024-05-07 MED ORDER — CETIRIZINE HCL 10 MG PO TABS: 10.0000 mg | ORAL_TABLET | Freq: Every day | ORAL | 11 refills | Status: AC

## 2024-05-07 NOTE — Patient Instructions (Signed)
 I have sent in Zyrtec  for you to take once a day  I have sent in triamcinolone  cream for you.  You may use this twice a day.  Follow-up with me in 2 days if the area is not improving.

## 2024-05-07 NOTE — Telephone Encounter (Signed)
 FYI Only or Action Required?: FYI only for provider.  Patient was last seen in primary care on 02/15/2024 by Rollene Almarie LABOR, MD.  Called Nurse Triage reporting Bullet Ant Bite.  Symptoms began a week ago.  Interventions attempted: OTC medications: Benadryl and Ice/heat application.  Symptoms are: gradually worsening.  Triage Disposition: See Physician Within 24 Hours  Patient/caregiver understands and will follow disposition?: Yes  Copied from CRM #[id number]. Topic: Clinical - Red Word Triage >> May 07, 2024 10:32 AM Corin V wrote: [facility] that prompted transfer to Nurse Triage:  Patient scheduled appointment via Mychart:  I got stung by a bullet ant in [place]. It's been a week and I was in pain the night of but a week later it has started to itch and it's swelling up. Reason for Disposition  [1] Red or very tender (to touch) area AND [2] started over 24 hours after the bite  Answer Assessment - Initial Assessment Questions 1. TYPE of INSECT: What type of insect was it?      Bullet Ant while in [place] 2. ONSET: When did you get bitten?      One week ago on Saturday  3. LOCATION: Where is the insect bite located?      Ankle 4. REDNESS: Is the area red or pink? If Yes, ask: What size is the area of redness? (inches or cm). When did the redness start?     yes 5. PAIN: Is there any pain? If Yes, ask: How bad is the pain? (Scale 0-10; or none, mild, moderate, severe)     Yes  6. ITCHING: Does it itch? If Yes, ask: How bad is the itch?      Yes 7. SWELLING: How big is the swelling? (e.g., inches, cm, or compare to coins)     Yes 8. OTHER SYMPTOMS: Do you have any other symptoms?  (e.g., difficulty breathing, fever, hives)     Denies  Additional info: Home treatments have been ineffective. Pain is improving, itching and swelling is worsening.  Protocols used: Insect Bite-A-AH

## 2024-05-07 NOTE — Progress Notes (Signed)
 Acute Office Visit  Subjective:     Patient ID: Carolyn Bautista, female    DOB: Feb 19, 2001, 23 y.o.   MRN: [id number]  No chief complaint on file.   HPI  Discussed the use of AI scribe software for clinical note transcription with the patient, who gave verbal consent to proceed.  History of Present Illness Carolyn Bautista is a 23 year old female who presents with itching and pain following a bullet ant sting.  Cutaneous symptoms following hymenoptera envenomation - Sustained a bullet ant sting last Saturday in [place] - Primary symptom is intense pruritus at the sting site - Occasional burning sensations at the affected area - No associated muscle pain or cramping - Applies over-the-counter antipruritic cream for symptom relief - Has not used antihistamines     ROS Per HPI      Objective:    BP 110/68 (BP Location: Left Arm, Patient Position: Sitting)   Pulse 85   Temp 98.4 F (36.9 C) (Temporal)   Ht 4' 11 (1.499 m)   Wt 137 lb 3.2 oz (62.2 kg)   SpO2 99%   BMI 27.71 kg/m    Physical Exam Vitals and nursing note reviewed.  Constitutional:      General: She is not in acute distress.    Appearance: Normal appearance. She is normal weight.  HENT:     Head: Normocephalic and atraumatic.     Right Ear: External ear normal.     Left Ear: External ear normal.  Eyes:     Extraocular Movements: Extraocular movements intact.  Cardiovascular:     Rate and Rhythm: Normal rate and regular rhythm.  Pulmonary:     Effort: Pulmonary effort is normal.  Musculoskeletal:        General: Normal range of motion.     Cervical back: Normal range of motion.     Right lower leg: No edema.     Left lower leg: No edema.  Lymphadenopathy:     Cervical: No cervical adenopathy.  Skin:    Findings: Lesion present.     Comments: Left medial ankle, see photo  Neurological:     General: No focal deficit present.     Mental Status: She is alert and oriented to person, place, and  time.  Psychiatric:        Mood and Affect: Mood normal.        Thought Content: Thought content normal.     No results found for any visits on 05/07/24.      Assessment & Plan:   Assessment and Plan Assessment & Plan Bullet ant sting with localized pruritus Experienced a bullet ant sting with persistent localized itching and occasional burning. No systemic symptoms. - Prescribed Zyrtec  for antihistamine treatment. - Prescribed stronger steroid cream for topical application. - Instructed to take a picture if no improvement by Wednesday. - Advised to contact the office if no improvement in two days for further evaluation and potential treatment change.     No orders of the defined types were placed in this encounter.    Meds ordered this encounter  Medications   triamcinolone  cream (KENALOG ) 0.1 %    Sig: Apply 1 Application topically 2 (two) times daily.    Dispense:  30 g    Refill:  0   cetirizine  (ZYRTEC ) 10 MG tablet    Sig: Take 1 tablet (10 mg total) by mouth daily.    Dispense:  30 tablet    Refill:  11    Return if symptoms worsen or fail to improve.  Corean LITTIE Ku, FNP

## 2024-05-15 ENCOUNTER — Ambulatory Visit: Admitting: Internal Medicine
